# Patient Record
Sex: Female | Born: 1950 | Race: White | Hispanic: No | Smoking: Former smoker
Health system: Southern US, Community
[De-identification: ages and names within clinical notes are randomized; demographics above are authoritative.]

## PROBLEM LIST (undated history)

## (undated) DIAGNOSIS — E785 Hyperlipidemia, unspecified: Secondary | ICD-10-CM

## (undated) DIAGNOSIS — H269 Unspecified cataract: Secondary | ICD-10-CM

## (undated) DIAGNOSIS — J45909 Unspecified asthma, uncomplicated: Secondary | ICD-10-CM

## (undated) DIAGNOSIS — E079 Disorder of thyroid, unspecified: Secondary | ICD-10-CM

## (undated) HISTORY — PX: BREAST SURGERY: SHX581

## (undated) HISTORY — DX: Unspecified cataract: H26.9

## (undated) HISTORY — PX: ABDOMINAL HYSTERECTOMY: SHX81

## (undated) HISTORY — PX: BREAST EXCISIONAL BIOPSY: SUR124

## (undated) HISTORY — DX: Unspecified asthma, uncomplicated: J45.909

## (undated) HISTORY — DX: Disorder of thyroid, unspecified: E07.9

## (undated) HISTORY — DX: Hyperlipidemia, unspecified: E78.5

---

## 2015-09-05 LAB — LIPID PANEL
Cholesterol: 194 mg/dL (ref 0–200)
HDL: 54 mg/dL (ref 35–70)
LDL Cholesterol: 107 mg/dL

## 2015-09-05 LAB — HEMOGLOBIN A1C: Hemoglobin A1C: 6.3

## 2015-09-05 LAB — TSH: TSH: 1.17 u[IU]/mL (ref <=5.90)

## 2015-09-05 LAB — BASIC METABOLIC PANEL: CREATININE: 0.8 mg/dL (ref <=1.1)

## 2016-04-26 ENCOUNTER — Encounter: Payer: Self-pay | Admitting: Family Medicine

## 2016-04-26 ENCOUNTER — Ambulatory Visit (INDEPENDENT_AMBULATORY_CARE_PROVIDER_SITE_OTHER): Payer: Self-pay | Admitting: Family Medicine

## 2016-04-26 VITALS — BP 136/82 | HR 73 | Temp 97.9°F | Ht 59.45 in | Wt 163.6 lb

## 2016-04-26 DIAGNOSIS — Z6832 Body mass index (BMI) 32.0-32.9, adult: Secondary | ICD-10-CM | POA: Diagnosis not present

## 2016-04-26 DIAGNOSIS — J4520 Mild intermittent asthma, uncomplicated: Secondary | ICD-10-CM | POA: Diagnosis not present

## 2016-04-26 DIAGNOSIS — E034 Atrophy of thyroid (acquired): Secondary | ICD-10-CM | POA: Diagnosis not present

## 2016-04-26 DIAGNOSIS — H6592 Unspecified nonsuppurative otitis media, left ear: Secondary | ICD-10-CM | POA: Diagnosis not present

## 2016-04-26 LAB — TSH: TSH: 0.84 mIU/L

## 2016-04-26 MED ORDER — ALBUTEROL SULFATE HFA 108 (90 BASE) MCG/ACT IN AERS: 2.0000 | INHALATION_SPRAY | Freq: Four times a day (QID) | RESPIRATORY_TRACT | 3 refills | Status: DC | PRN

## 2016-04-26 NOTE — Patient Instructions (Signed)
Thanks for coming in today  For exercise please look at HIIT- high intensity interval training  Increase vegetable consumption

## 2016-04-26 NOTE — Progress Notes (Signed)
Pre visit review using our clinic review tool, if applicable. No additional management support is needed unless otherwise documented below in the visit note. 

## 2016-04-26 NOTE — Progress Notes (Signed)
Subjective:    Patient ID: Tanya Newman, female    DOB: 05/22/1950, 66 y.o.   MRN: [id number]  HPI This is a 66 yo female who has recently moved to [place] from [place]. She presents today to establish care. She moved to be closer to her daughter and grandchildren. She was able to keep her job and work from home. This has been an adjustment. She has joined the [employer] and enjoys pickleball. Was playing regularly in the fall and had blood work for insurance, HgbA1C had dropped below 6.  She brings her records with her today. Had CPE 6/17. Up to date on vaccines, FOBT, gyn, mammo.   Her only concern today is hypothyroidism- has noticed weight gain, fatigue recently, would like TSH checked. Endorses increased stress over last year.  Asthma- well controlled on Advair, rarely requires albuterol. Unable to take antihistamines, can take Nasacort. Requests refill of albuterol. Prefers [employer], works better for her.   Left ear- sensation of fullness. Had URI with ear infection 2 months ago. Treated at an urgent care with [facility]. Feels like it needs to pop. No drainage, no jaw pain. No decreased hearing.   Past Medical History:  Diagnosis Date  . Asthma   . Cataract   . Thyroid disease    Past Surgical History:  Procedure Laterality Date  . ABDOMINAL HYSTERECTOMY    . BREAST SURGERY     Family History  Problem Relation Age of Onset  . Diabetes Mother   . Stroke Mother   . Hypertension Mother   . Early death Father   . Heart disease Father   . Heart disease Sister   . Diabetes Sister   . Hypertension Sister   . Stroke Brother   . Cancer Brother    Social History  Substance Use Topics  . Smoking status: Former Research scientist (life sciences)  . Smokeless tobacco: Never Used  . Alcohol use Not on file      Review of Systems Per HPI    Objective:   Physical Exam  Constitutional: She is oriented to person, place, and time. She appears well-developed and well-nourished. No distress.  HENT:  Head:  Normocephalic and atraumatic.  Right Ear: Tympanic membrane, external ear and ear canal normal.  Left Ear: External ear normal. No tenderness. Tympanic membrane is not erythematous. A middle ear effusion is present.  Nose: Nose normal.  Mouth/Throat: Oropharynx is clear and moist.  Gross hearing intact.   Eyes: Conjunctivae are normal.  Cardiovascular: Normal rate, regular rhythm and normal heart sounds.   Pulmonary/Chest: Effort normal and breath sounds normal.  Lymphadenopathy:    She has no cervical adenopathy.  Neurological: She is alert and oriented to person, place, and time.  Skin: Skin is warm and dry. She is not diaphoretic.  Psychiatric: She has a normal mood and affect. Her behavior is normal. Judgment and thought content normal.  Vitals reviewed.     BP 136/82 (BP Location: Left Arm, Patient Position: Sitting, Cuff Size: Normal)   Pulse 73   Temp 97.9 F (36.6 C) (Oral)   Ht 4' 11.45" (1.51 m)   Wt 163 lb 9.6 oz (74.2 kg)   SpO2 98%   BMI 32.55 kg/m      Assessment & Plan:  1. Mild intermittent asthma without complication - albuterol (PROAIR HFA) 108 (90 Base) MCG/ACT inhaler; Inhale 2 puffs into the lungs every 6 (six) hours as needed for wheezing or shortness of breath.  Dispense: 18 g; Refill: 3  2. Hypothyroidism due to acquired atrophy of thyroid - TSH  3. Middle ear effusion, left - likely related to recent AOM, discussed starting Nasacort, symptoms should improve, if not or if pain or change in hearing, will refer to ENT  4. BMI 32.0-32.9, adult - discussed patient's concerns about developing diabetes due to strong family history and previous prediabetic HgbA1C. Discussed barriers to healthy food choices and regular exercise.   Follow up around 6/18 for CPE     Clarene Reamer, FNP-BC  [facility]  04/27/2016 9:27 AM

## 2016-04-27 DIAGNOSIS — E034 Atrophy of thyroid (acquired): Secondary | ICD-10-CM | POA: Insufficient documentation

## 2016-04-27 DIAGNOSIS — Z6832 Body mass index (BMI) 32.0-32.9, adult: Secondary | ICD-10-CM | POA: Insufficient documentation

## 2016-04-27 DIAGNOSIS — J4520 Mild intermittent asthma, uncomplicated: Secondary | ICD-10-CM | POA: Insufficient documentation

## 2016-04-29 ENCOUNTER — Encounter: Payer: Self-pay | Admitting: Family Medicine

## 2016-07-03 ENCOUNTER — Other Ambulatory Visit: Payer: Self-pay | Admitting: Family Medicine

## 2016-07-03 ENCOUNTER — Encounter: Payer: Self-pay | Admitting: Family Medicine

## 2016-07-03 DIAGNOSIS — E039 Hypothyroidism, unspecified: Secondary | ICD-10-CM

## 2016-07-03 DIAGNOSIS — E785 Hyperlipidemia, unspecified: Secondary | ICD-10-CM

## 2016-07-03 DIAGNOSIS — J4520 Mild intermittent asthma, uncomplicated: Secondary | ICD-10-CM

## 2016-07-03 MED ORDER — LEVOTHYROXINE SODIUM 112 MCG PO TABS: 112.0000 ug | ORAL_TABLET | Freq: Every day | ORAL | 1 refills | Status: DC

## 2016-07-03 MED ORDER — GEMFIBROZIL 600 MG PO TABS: 600.0000 mg | ORAL_TABLET | Freq: Every day | ORAL | 1 refills | Status: DC

## 2016-07-03 MED ORDER — MONTELUKAST SODIUM 10 MG PO TABS: 10.0000 mg | ORAL_TABLET | Freq: Every day | ORAL | 1 refills | Status: DC

## 2016-07-22 ENCOUNTER — Ambulatory Visit (INDEPENDENT_AMBULATORY_CARE_PROVIDER_SITE_OTHER): Payer: BLUE CROSS/BLUE SHIELD | Admitting: Family Medicine

## 2016-07-22 ENCOUNTER — Encounter: Payer: Self-pay | Admitting: Family Medicine

## 2016-07-22 VITALS — BP 130/78 | HR 79 | Temp 98.3°F | Wt 162.4 lb

## 2016-07-22 DIAGNOSIS — Z0000 Encounter for general adult medical examination without abnormal findings: Secondary | ICD-10-CM

## 2016-07-22 DIAGNOSIS — Z85828 Personal history of other malignant neoplasm of skin: Secondary | ICD-10-CM | POA: Insufficient documentation

## 2016-07-22 DIAGNOSIS — H9193 Unspecified hearing loss, bilateral: Secondary | ICD-10-CM

## 2016-07-22 DIAGNOSIS — J309 Allergic rhinitis, unspecified: Secondary | ICD-10-CM

## 2016-07-22 DIAGNOSIS — D229 Melanocytic nevi, unspecified: Secondary | ICD-10-CM | POA: Diagnosis not present

## 2016-07-22 DIAGNOSIS — R7303 Prediabetes: Secondary | ICD-10-CM | POA: Diagnosis not present

## 2016-07-22 DIAGNOSIS — R1013 Epigastric pain: Secondary | ICD-10-CM

## 2016-07-22 NOTE — Progress Notes (Signed)
Subjective:    Patient ID: Tanya Newman, female    DOB: 1950-09-21, 66 y.o.   MRN: [id number]  HPI  This is a 66 yo female who presents today for CPE. Has been very busy with her work. Did take a week off to go to [place] with her children and grandchildren.   Last CPE- 6/17 Mammo- several years Pap- hysterectomy  Colonoscopy- FOBT 04/2015 Tdap- 08/2015 Zostavax- 06/2012 Flu- annual Eye-up to date Dental-has appointment for next month Exercise-has not been exercising as much as she would like due to work demands    Allergies have been worse. Still on singulair and nasacort. Unable to tolerate antihistamines- gets very jittery. Worse symptom eye irritation. Has known dry eyes and is on Restasis.   Recently noticed more difficulty with hearing when there is back ground noise. Has to turn TV up. Worse in left ear. It itches and bothers her. Sometimes feel like balance is off, no falls. Has URI 12/17 and was told she had a lot of fluid behind ears. Doesn't feel like it has resolved. No occupational or recreational exposure to prolonged loud noises.    Has several years of abdominal pain. Thought it was related to stress so changed her job. Has eliminated all dairy products. Now has symptoms a couple of days a week. Feels like she has a brick in her stomach. Pain upper abdomen. Lasts for 1/2- 1 hour. BMs regular 2x/ day. Drinking a probiotic drink daily, not a full dose. Pain resolves spontaneously, not necessarily accompanied by bloating or gas. She is wondering if related to wheat consumption.     Past Medical History:  Diagnosis Date  . Asthma   . Cataract   . Thyroid disease    Past Surgical History:  Procedure Laterality Date  . ABDOMINAL HYSTERECTOMY    . BREAST SURGERY     Family History  Problem Relation Age of Onset  . Diabetes Mother   . Stroke Mother   . Hypertension Mother   . Early death Father   . Heart disease Father   . Heart disease Sister   . Diabetes  Sister   . Hypertension Sister   . Stroke Brother   . Cancer Brother    Social History  Substance Use Topics  . Smoking status: Former Research scientist (life sciences)  . Smokeless tobacco: Never Used  . Alcohol use Not on file      Review of Systems  Constitutional: Negative.   HENT: Positive for hearing loss.   Respiratory: Negative.   Cardiovascular: Negative.   Gastrointestinal: Positive for abdominal pain. Negative for constipation, diarrhea and nausea.  Endocrine: Negative.   Genitourinary: Negative.   Musculoskeletal: Negative.   Allergic/Immunologic: Positive for environmental allergies (scratcy throat, watery eyes, clear nasal drainage. ).  Neurological: Negative.   Hematological: Negative.        Objective:   Physical Exam Physical Exam  Constitutional: She is oriented to person, place, and time. She appears well-developed and well-nourished. No distress.  HENT:  Head: Normocephalic and atraumatic.  Right Ear: External ear normal.  Left Ear: External ear normal.  Nose: Nose normal.  Mouth/Throat: Oropharynx is clear and moist. No oropharyngeal exudate.  Ears: bilateral TMs dull Eyes: Conjunctivae are normal. Pupils are equal, round, and reactive to light.  Neck: Normal range of motion. Neck supple. No JVD present. No thyromegaly present.  Cardiovascular: Normal rate, regular rhythm, normal heart sounds and intact distal pulses.   Pulmonary/Chest: Effort normal and breath sounds normal. Right  breast exhibits no inverted nipple, no mass, no nipple discharge, no skin change and no tenderness. Left breast exhibits no inverted nipple, no mass, no nipple discharge, no skin change and no tenderness. Breasts are symmetrical.  Abdominal: Soft. Bowel sounds are normal. She exhibits no distension and no mass. There is no tenderness. There is no rebound and no guarding.  Musculoskeletal: Normal range of motion. She exhibits no edema or tenderness.  Lymphadenopathy:    She has no cervical  adenopathy.  Neurological: She is alert and oriented to person, place, and time. She has normal reflexes.  Skin: Skin is warm and dry. She is not diaphoretic. Numerous moles on upper chest.  Psychiatric: She has a normal mood and affect. Her behavior is normal. Judgment and thought content normal.  Vitals reviewed.  BP 130/78 (BP Location: Right Arm, Patient Position: Sitting, Cuff Size: Normal)   Pulse 79   Temp 98.3 F (36.8 C) (Oral)   Wt 162 lb 6.4 oz (73.7 kg)   SpO2 98%   BMI 32.31 kg/m  Wt Readings from Last 3 Encounters:  07/22/16 162 lb 6.4 oz (73.7 kg)  04/26/16 163 lb 9.6 oz (74.2 kg)   Depression screen PHQ 2/9 07/22/2016  Decreased Interest 0  Down, Depressed, Hopeless 0  PHQ - 2 Score 0   Office audiometry- pass right ear, fail left ear    Assessment & Plan:  1. Annual physical exam - Discussed and encouraged healthy lifestyle choices- adequate sleep, regular exercise, stress management and healthy food choices.  - patient wishes to defer labs until follow up visit  2. Allergic rhinitis, unspecified seasonality, unspecified trigger - discussed additional treatment of symptoms including local honey, antihistamine eye drops, saline nasal spray  3. Decreased hearing of both ears - Ambulatory referral to ENT  4. Epigastric pain - this has decreased in frequency since she eliminated dairy. Encouraged her to keep a food/symptom log to look for patterns. Consider [place] if no improvement or if symptoms seem to correlate with heavy/greasy foods  5. Personal history of squamous cell carcinoma of skin - discussed importance of regular dermatology follow up - Ambulatory referral to Dermatology  6. Prediabetes - encouraged increased exercise and healthy food choices - will check labs at follow up visit in 6 months per patient request  7. Numerous moles - Ambulatory referral to Dermatology   Clarene Reamer, FNP-BC  [facility]  07/24/2016 8:24 AM

## 2016-07-22 NOTE — Patient Instructions (Addendum)
Antihistamine eye drops and or lubrication eye drops  Probiotic- Florastor  Please call and schedule an appointment for screening mammogram. A referral is not needed.  [facility]    Please let me know which ENT provider you want to see and I will place referral  I have put in dermatology referral- you will get a call about an appointment  Keeping You Healthy  Get These Tests  Blood Pressure- Have your blood pressure checked by your healthcare provider at least once a year.  Normal blood pressure is 120/80.  Weight- Have your body mass index (BMI) calculated to screen for obesity.  BMI is a measure of body fat based on height and weight.  You can calculate your own BMI at [employer]velBags.it  Cholesterol- Have your cholesterol checked every year.  Diabetes- Have your blood sugar checked every year if you have high blood pressure, high cholesterol, a family history of diabetes or if you are overweight.  Pap Test - Have a pap test every 1 to 5 years if you have been sexually active.  If you are older than 65 and recent pap tests have been normal you may not need additional pap tests.  In addition, if you have had a hysterectomy  for benign disease additional pap tests are not necessary.  Mammogram-Yearly mammograms are essential for early detection of breast cancer  Screening for Colon Cancer- Colonoscopy starting at age 63. Screening may begin sooner depending on your family history and other health conditions.  Follow up colonoscopy as directed by your Gastroenterologist.  Screening for Osteoporosis- Screening begins at age 39 with bone density scanning, sooner if you are at higher risk for developing Osteoporosis.  Get these medicines  Calcium with Vitamin D- Your body requires 1200-1500 mg of Calcium a day and [phone] IU of Vitamin D a day.  You can only absorb 500 mg of Calcium at a time therefore Calcium must be taken in 2 or  3 separate doses throughout the day.  Hormones- Hormone therapy has been associated with increased risk for certain cancers and heart disease.  Talk to your healthcare provider about if you need relief from menopausal symptoms.  Aspirin- Ask your healthcare provider about taking Aspirin to prevent Heart Disease and Stroke.  Get these Immuniztions  Flu shot- Every fall  Pneumonia shot- Once after the age of 7; if you are younger ask your healthcare provider if you need a pneumonia shot.  Tetanus- Every ten years.  Zostavax- Once after the age of 52 to prevent shingles.  Take these steps  Don't smoke- Your healthcare provider can help you quit. For tips on how to quit, ask your healthcare provider or go to www.smokefree.gov or call 1-800 QUIT-NOW.  Be physically active- Exercise 5 days a week for a minimum of 30 minutes.  If you are not already physically active, start slow and gradually work up to 30 minutes of moderate physical activity.  Try walking, dancing, bike riding, swimming, etc.  Eat a healthy diet- Eat a variety of healthy foods such as fruits, vegetables, whole grains, low fat milk, low fat cheeses, yogurt, lean meats, chicken, fish, eggs, dried beans, tofu, etc.  For more information go to www.thenutritionsource.org  Dental visit- Brush and floss teeth twice daily; visit your dentist twice a year.  Eye exam- Visit your Optometrist or Ophthalmologist yearly.  Drink alcohol in moderation- Limit alcohol intake to one drink or less a day.  Never drink and drive.  Depression- Your emotional health is as important as your physical health.  If you're feeling down or losing interest in things you normally enjoy, please talk to your healthcare provider.  Seat Belts- can save your life; always wear one  Smoke/Carbon Monoxide detectors- These detectors need to be installed on the appropriate level of your home.  Replace batteries at least once a year.  Violence- If anyone is  threatening or hurting you, please tell your healthcare provider.  Living Will/ Health care power of attorney- Discuss with your healthcare provider and family.

## 2016-07-24 ENCOUNTER — Encounter: Payer: Self-pay | Admitting: Family Medicine

## 2016-08-01 ENCOUNTER — Encounter: Payer: Self-pay | Admitting: Family Medicine

## 2016-08-03 ENCOUNTER — Other Ambulatory Visit: Payer: Self-pay | Admitting: Family Medicine

## 2016-08-03 DIAGNOSIS — H9192 Unspecified hearing loss, left ear: Secondary | ICD-10-CM

## 2016-08-03 DIAGNOSIS — H7392 Unspecified disorder of tympanic membrane, left ear: Secondary | ICD-10-CM

## 2016-11-21 ENCOUNTER — Encounter: Payer: Self-pay | Admitting: Family Medicine

## 2016-11-22 ENCOUNTER — Ambulatory Visit (INDEPENDENT_AMBULATORY_CARE_PROVIDER_SITE_OTHER): Payer: BLUE CROSS/BLUE SHIELD | Admitting: Sports Medicine

## 2016-11-22 ENCOUNTER — Ambulatory Visit: Payer: Self-pay

## 2016-11-22 ENCOUNTER — Ambulatory Visit (INDEPENDENT_AMBULATORY_CARE_PROVIDER_SITE_OTHER): Payer: BLUE CROSS/BLUE SHIELD

## 2016-11-22 ENCOUNTER — Encounter: Payer: Self-pay | Admitting: Sports Medicine

## 2016-11-22 VITALS — BP 120/82 | HR 79 | Ht 59.45 in | Wt 167.6 lb

## 2016-11-22 DIAGNOSIS — M25561 Pain in right knee: Secondary | ICD-10-CM

## 2016-11-22 DIAGNOSIS — R7303 Prediabetes: Secondary | ICD-10-CM | POA: Diagnosis not present

## 2016-11-22 DIAGNOSIS — M25569 Pain in unspecified knee: Secondary | ICD-10-CM | POA: Insufficient documentation

## 2016-11-22 NOTE — Assessment & Plan Note (Signed)
Consistent with degenerative medial meniscus and mild underlying we will plan to go ahead and inject her today.  She tolerated this well.  Small effusion only.  Continue with compression and avoidance of exacerbating activities but encouraged her to remain mobile and active.  If any lack of improvement after 6 weeks she will return but otherwise just as needed.

## 2016-11-22 NOTE — Procedures (Signed)
PROCEDURE NOTE - ULTRASOUND GUIDED INJECTION: Right Knee Images were obtained and interpreted by myself, Teresa Coombs, DO  Images have been saved and stored to PACS system. Images obtained on: GE S7 Ultrasound machine  ULTRASOUND FINDINGS:  Small to moderate effusion. 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  Narrowing of the medial joint line but no focal osteophytic spurring.  DESCRIPTION OF PROCEDURE:  The patient's clinical condition is marked by substantial pain and/or significant functional disability. Other conservative therapy has not provided relief, is contraindicated, or not appropriate. There is a reasonable likelihood that injection will significantly improve the patient's pain and/or functional impairment. After discussing the risks, benefits and expected outcomes of the injection and all questions were reviewed and answered, the patient wished to undergo the above named procedure. Verbal consent was obtained. The ultrasound was used to identify the target structure and adjacent neurovascular structures. The skin was then prepped in sterile fashion and the target structure was injected under direct visualization using sterile technique as below: PREP: Alcohol, Ethel Chloride APPROACH: Superiolateral, single injection, 25g 1.5" needle INJECTATE: 2cc 1% lidocaine, 2 cc 40mg  DepoMedrol ASPIRATE: N/A DRESSING: Band-Aid & full knee Body Helix  Post procedural instructions including recommending icing and warning signs for infection were reviewed. This procedure was well tolerated and there were no complications.   IMPRESSION: Succesful US Guided Injection

## 2016-11-22 NOTE — Patient Instructions (Addendum)
You had an injection today.  Things to be aware of after injection are listed below: . You may experience no significant improvement or even a slight worsening in your symptoms during the first 24 to 48 hours.  After that we expect your symptoms to improve gradually over the next 2 weeks for the medicine to have its maximal effect.  You should continue to have improvement out to 6 weeks after your injection. . Dr. Rigby recommends icing the site of the injection for 20 minutes  1-2 times the day of your injection . You may shower but no swimming, tub bath or Jacuzzi for 24 hours. . If your bandage falls off this does not need to be replaced.  It is appropriate to remove the bandage after 4 hours. . You may resume light activities as tolerated unless otherwise directed per Dr. Rigby during your visit  POSSIBLE STEROID SIDE EFFECTS:  Side effects from injectable steroids tend to be less than when taken orally however you may experience some of the symptoms listed below.  If experienced these should only last for a short period of time. Change in menstrual flow  Edema (swelling)  Increased appetite Skin flushing (redness)  Skin rash/acne  Thrush (oral) Yeast vaginitis    Increased sweating  Depression Increased blood glucose levels Cramping and leg/calf  Euphoria (feeling happy)  POSSIBLE PROCEDURE SIDE EFFECTS: The side effects of the injection are usually fairly minimal however if you may experience some of the following side effects that are usually self-limited and will is off on their own.  If you are concerned please feel free to call the office with questions:  Increased numbness or tingling  Nausea or vomiting  Swelling or bruising at the injection site   Please call our office if if you experience any of the following symptoms over the next week as these can be signs of infection:   Fever greater than 100.5F  Significant swelling at the injection site  Significant redness or drainage  from the injection site  If after 2 weeks you are continuing to have worsening symptoms please call our office to discuss what the next appropriate actions should be including the potential for a return office visit or other diagnostic testing.      I recommend you obtained a compression sleeve to help with your joint problems. There are many options on the market however I recommend obtaining a Full Knee Body Helix compression sleeve.  You can find information (including how to appropriate measure yourself for sizing) can be found at www.Body Helix.com.  Many of these products are health savings account (HSA) eligible.   You can use the compression sleeve at any time throughout the day but is most important to use while being active as well as for 2 hours post-activity.   It is appropriate to ice following activity with the compression sleeve in place.    

## 2016-11-22 NOTE — Progress Notes (Signed)
OFFICE VISIT NOTE Tanya Newman. Newman, Tanya at [facility] [phone]  Tanya Newman - 66 y.o. female MRN [id number]  Date of birth: 11/28/1950  Visit Date: 11/22/2016  PCP: [name], FNP   Referred by: [name], FNP  [name], CMA acting as scribe for Dr. [name].  SUBJECTIVE:   Chief Complaint  Patient presents with  . RT knee pain and swelling   HPI: As below and per problem based documentation when appropriate.  Tanya Newman is a new patient presenting today with complaint of RT knee pain and swelling. Pain is all around the knee, anterior and posterior while driving, medial and lateral when at home resting, and all over pain when walking.  Pain has been present x 2-3 weeks.  No known injury or trauma to the knee. She has hx of fracture to the lateral tibia, pain flares up with cold weather.   Pain is described as aching and throbbing and is rated 5/10 when walking and later in the pain.   Pain is worse when walking, going up and down stairs. Pain is normally worse when first getting up in the morning.  Pain is alleviated with icing the knee. She has not tried using brace or compression.  She has tried taking Tylenol prn for the pain.   Pain radiates into the upper and lower leg. She denies pain in the hip and ankle.   No recent xray of the knee.      Review of Systems  Constitutional: Negative for chills and fever.  Respiratory: Negative for shortness of breath and wheezing.   Cardiovascular: Positive for leg swelling. Negative for chest pain and palpitations.  Gastrointestinal: Negative.   Genitourinary: Negative.   Musculoskeletal: Positive for joint pain. Negative for falls.  Neurological: Negative for dizziness, tingling and headaches.  Endo/Heme/Allergies: Does not bruise/bleed easily.    Otherwise per HPI.  HISTORY & PERTINENT PRIOR DATA:  No specialty comments available. She reports that she  has quit smoking. She has never used smokeless tobacco. No results for input(s): HGBA1C, LABURIC in the last 8760 hours. Medications & Allergies reviewed per EMR Patient Active Problem List   Diagnosis Date Noted  . Knee pain 11/22/2016  . Personal history of squamous cell carcinoma of skin 07/22/2016  . Prediabetes 07/22/2016  . Mild intermittent asthma without complication 57/32/2025  . Hypothyroidism due to acquired atrophy of thyroid 04/27/2016  . BMI 32.0-32.9,adult 04/27/2016   Past Medical History:  Diagnosis Date  . Asthma   . Cataract   . Thyroid disease    Family History  Problem Relation Age of Onset  . Diabetes Mother   . Stroke Mother   . Hypertension Mother   . Early death Father   . Heart disease Father   . Heart disease Sister   . Diabetes Sister   . Hypertension Sister   . Stroke Brother   . Cancer Brother    Past Surgical History:  Procedure Laterality Date  . ABDOMINAL HYSTERECTOMY    . BREAST SURGERY     Social History   Occupational History  . Not on file.   Social History Main Topics  . Smoking status: Former Research scientist (life sciences)  . Smokeless tobacco: Never Used  . Alcohol use Not on file  . Drug use: Unknown  . Sexual activity: No    OBJECTIVE:  VS:  HT:4' 11.45" (151 cm)   WT:167 lb 9.6 oz (76 kg)  BMI:33.34  BP:120/82  HR:79bpm  TEMP: ( )  RESP:96 % EXAM: Findings:  WDWN, NAD, Non-toxic appearing Alert & appropriately interactive Not depressed or anxious appearing No increased work of breathing. Pupils are equal. EOM intact without nystagmus No clubbing or cyanosis of the extremities appreciated No significant rashes/lesions/ulcerations overlying the examined area. DP & PT pulses 2+/4.  No significant pretibial edema.  No clubbing or cyanosis Sensation intact to light touch in lower extremities.  Right Knee: Overall joint is well aligned, no significant deformity.   Small to moderate effusion..   ROM: 0 to 120.   Extensor  mechanism intact Generalized medial and lateral joint line pain without focal deformity..   Stable to varus/valgus strain & anterior/posterior drawer.     Mild pain with McMurray's but no reproducible mechanical clicking.       [place] Limited Joint Space Structures Low Right(no Linked Charges)  Result Date: 11/22/2016 [name], DO     11/22/2016 12:01 PM PROCEDURE NOTE - ULTRASOUND GUIDED INJECTION: Right Knee Images were obtained and interpreted by myself, Teresa Coombs, DO Images have been saved and stored to PACS system. Images obtained on: GE S7 Ultrasound machine  ULTRASOUND FINDINGS: Small to moderate effusion. 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  Narrowing of the medial joint line but no focal osteophytic spurring. DESCRIPTION OF PROCEDURE: The patient's clinical condition is marked by substantial pain and/or significant functional disability. Other conservative therapy has not provided relief, is contraindicated, or not appropriate. There is a reasonable likelihood that injection will significantly improve the patient's pain and/or functional impairment. After discussing the risks, benefits and expected outcomes of the injection and all questions were reviewed and answered, the patient wished to undergo the above named procedure. Verbal consent was obtained. The ultrasound was used to identify the target structure and adjacent neurovascular structures. The skin was then prepped in sterile fashion and the target structure was injected under direct visualization using sterile technique as below: PREP: Alcohol, Ethel Chloride APPROACH: Superiolateral, single injection, 25g 1.5" needle INJECTATE: 2cc 1% lidocaine, 2 cc 40mg  DepoMedrol ASPIRATE: N/A DRESSING: Band-Aid & full knee Body Helix Post procedural instructions including recommending icing and warning signs for infection were reviewed. This procedure was well  tolerated and there were no complications.  IMPRESSION: Succesful US Guided Injection   ASSESSMENT & PLAN:     ICD-10-CM   1. Acute pain of right knee M25.561 DG Knee AP/LAT W/Sunrise Right    [place] LIMITED JOINT SPACE STRUCTURES LOW RIGHT(NO LINKED CHARGES)  2. Prediabetes R73.03   ================================================================= Knee pain Consistent with degenerative medial meniscus and mild underlying we will plan to go ahead and inject her today.  She tolerated this well.  Small effusion only.  Continue with compression and avoidance of exacerbating activities but encouraged her to remain mobile and active.  If any lack of improvement after 6 weeks she will return but otherwise just as needed. =================================================================  Follow-up: Return in about 6 weeks (around 01/03/2017).   CMA/ATC served as Education administrator during this visit. History, Physical, and Plan performed by medical provider. Documentation and orders reviewed and attested to.      Teresa Coombs, [place] [facility] Physician

## 2016-12-31 ENCOUNTER — Other Ambulatory Visit: Payer: Self-pay | Admitting: Emergency Medicine

## 2016-12-31 ENCOUNTER — Telehealth: Payer: Self-pay | Admitting: Family Medicine

## 2016-12-31 DIAGNOSIS — E785 Hyperlipidemia, unspecified: Secondary | ICD-10-CM

## 2016-12-31 MED ORDER — GEMFIBROZIL 600 MG PO TABS: 600.0000 mg | ORAL_TABLET | Freq: Every day | ORAL | 0 refills | Status: DC

## 2016-12-31 NOTE — Telephone Encounter (Signed)
30 day supply sent to pharmacy. Lab appointment needed and further refills will go to new PCP.

## 2016-12-31 NOTE — Telephone Encounter (Signed)
MEDICATION: gemfibrozil (LOPID) 600 MG tablet  PHARMACY:   [id number] - [place], [place] - [address] [phone] (Phone) [phone] (Fax)     IS THIS A 90 DAY SUPPLY : Y  IS PATIENT OUT OF MEDICATION: N  IF NOT; HOW MUCH IS LEFT: 5 pills  LAST APPOINTMENT DATE: 07/22/16  NEXT APPOINTMENT DATE: 01/22/17 with Dr. Jerline Pain  OTHER COMMENTS: Patient transferring to Dr. Jerline Pain   **Let patient know to contact pharmacy at the end of the day to make sure medication is ready. **  ** Please notify patient to allow 48-72 hours to process**  **Encourage patient to contact the pharmacy for refills or they can request refills through [facility]**

## 2017-01-02 ENCOUNTER — Telehealth: Payer: Self-pay | Admitting: Family Medicine

## 2017-01-02 DIAGNOSIS — J4520 Mild intermittent asthma, uncomplicated: Secondary | ICD-10-CM

## 2017-01-02 DIAGNOSIS — E039 Hypothyroidism, unspecified: Secondary | ICD-10-CM

## 2017-01-02 MED ORDER — MONTELUKAST SODIUM 10 MG PO TABS: 10.0000 mg | ORAL_TABLET | Freq: Every day | ORAL | 3 refills | Status: DC

## 2017-01-02 MED ORDER — LEVOTHYROXINE SODIUM 112 MCG PO TABS: 112.0000 ug | ORAL_TABLET | Freq: Every day | ORAL | 3 refills | Status: DC

## 2017-01-02 NOTE — Telephone Encounter (Signed)
MEDICATION:  levothyroxine (SYNTHROID, LEVOTHROID) 112 MCG tablet    montelukast (SINGULAIR) 10 MG tablet     PHARMACY:   [id number] - [place], [place] - [address] [phone] (Phone) [phone] (Fax)     IS THIS A 90 DAY SUPPLY : yes  IS PATIENT OUT OF MEDICATION: yes  IF NOT; HOW MUCH IS LEFT:   LAST APPOINTMENT DATE: @05 /07/18  NEXT APPOINTMENT DATE:@11 /07/18 OTHER COMMENTS: patient transferred from [place] to Dr. Jerline Pain. Has new patient appointment on 01/22/17.    **Let patient know to contact pharmacy at the end of the day to make sure medication is ready. **  ** Please notify patient to allow 48-72 hours to process**  **Encourage patient to contact the pharmacy for refills or they can request refills through [facility]**

## 2017-01-02 NOTE — Telephone Encounter (Signed)
Rx filled.  Algis Greenhouse. Jerline Pain, MD 01/02/2017 1:55 PM

## 2017-01-03 ENCOUNTER — Ambulatory Visit (INDEPENDENT_AMBULATORY_CARE_PROVIDER_SITE_OTHER): Payer: BLUE CROSS/BLUE SHIELD | Admitting: Sports Medicine

## 2017-01-03 ENCOUNTER — Encounter: Payer: Self-pay | Admitting: Sports Medicine

## 2017-01-03 VITALS — BP 112/78 | HR 73 | Ht 59.45 in | Wt 168.2 lb

## 2017-01-03 DIAGNOSIS — M25561 Pain in right knee: Secondary | ICD-10-CM | POA: Diagnosis not present

## 2017-01-03 NOTE — Assessment & Plan Note (Signed)
Responded well to injection, recommend continuing with VMO strengthening.  Follow-up only as needed.  Continue compression when active.Marland Kitchen

## 2017-01-03 NOTE — Progress Notes (Signed)
OFFICE VISIT NOTE Tanya Newman. Tanya Newman, [place] at [facility] [phone]  Tanya Newman - 66 y.o. female MRN [id number]  Date of birth: 21-Jul-1950  Visit Date: 01/03/2017  PCP: [name], FNP   Referred by: [name], FNP  Josepha Pigg, CMA acting as scribe for Dr. [name].  SUBJECTIVE:   Chief Complaint  Patient presents with  . Follow-up    RT knee pain   HPI: As below and per problem based documentation when appropriate.  Tanya Newman is an established patient presenting today in follow-up of RT knee pain. She was last seen 11/22/16  received steroid injection and was advised to wear Body Helix compression sleeve.   Pt had pain in the RT knee the rest of the day after receiving the injection. She continued to have pain for the following 3 days but then the pain started to improve. By the 5th day she started taking short walks and would have some pain afterward. She continues to have mild pain after ambulating for extended periods of time. She feels like the pain has improved a lot since her last visit and the swelling has gone down. She has been wearing the Body Helix compression sleeve while being active or driving. She no longer has pain when going up and down stairs or when first waking up in the morning.     Review of Systems  Constitutional: Negative for chills, fever and malaise/fatigue.  Respiratory: Negative for shortness of breath and wheezing.   Cardiovascular: Negative for chest pain and palpitations.  Neurological: Negative for dizziness, tingling, weakness and headaches.    Otherwise per HPI.  HISTORY & PERTINENT PRIOR DATA:  No specialty comments available. She reports that she has quit smoking. She has never used smokeless tobacco. No results for input(s): HGBA1C, LABURIC in the last 8760 hours. Allergies reviewed per EMR Prior to Admission medications   Medication Sig Start Date End Date Taking?  Authorizing Provider  albuterol (PROAIR HFA) 108 (90 Base) MCG/ACT inhaler Inhale 2 puffs into the lungs every 6 (six) hours as needed for wheezing or shortness of breath. 04/26/16  Yes [name], FNP  cycloSPORINE (RESTASIS) 0.05 % ophthalmic emulsion 1 drop 2 (two) times daily.   Yes [provider]  doxycycline (VIBRAMYCIN) 50 MG capsule  07/05/16  Yes [provider]  Fluticasone-Salmeterol (ADVAIR) 500-50 MCG/DOSE AEPB Inhale 1 puff into the lungs 2 (two) times daily.   Yes [provider]  gemfibrozil (LOPID) 600 MG tablet Take 1 tablet (600 mg total) by mouth daily. 12/31/16  Yes [name], FNP  levothyroxine (SYNTHROID, LEVOTHROID) 112 MCG tablet Take 1 tablet (112 mcg total) by mouth daily before breakfast. 01/02/17  Yes Vivi Barrack, MD  montelukast (SINGULAIR) 10 MG tablet Take 1 tablet (10 mg total) by mouth at bedtime. 01/02/17  Yes Vivi Barrack, MD   Patient Active Problem List   Diagnosis Date Noted  . Knee pain 11/22/2016  . Personal history of squamous cell carcinoma of skin 07/22/2016  . Prediabetes 07/22/2016  . Mild intermittent asthma without complication 71/24/5809  . Hypothyroidism due to acquired atrophy of thyroid 04/27/2016  . BMI 32.0-32.9,adult 04/27/2016   Past Medical History:  Diagnosis Date  . Asthma   . Cataract   . Thyroid disease    Family History  Problem Relation Age of Onset  . Diabetes Mother   . Stroke Mother   . Hypertension Mother   .  Early death Father   . Heart disease Father   . Heart disease Sister   . Diabetes Sister   . Hypertension Sister   . Stroke Brother   . Cancer Brother    Past Surgical History:  Procedure Laterality Date  . ABDOMINAL HYSTERECTOMY    . BREAST SURGERY     Social History   Occupational History  . Not on file.   Social History Main Topics  . Smoking status: Former Research scientist (life sciences)  . Smokeless tobacco: Never Used  . Alcohol use Not on file  . Drug use: Unknown    . Sexual activity: No    OBJECTIVE:  VS:  HT:4' 11.45" (151 cm)   WT:168 lb 3.2 oz (76.3 kg)  BMI:33.46    BP:112/78  HR:73bpm  TEMP: ( )  RESP:98 % EXAM: Findings:  Knee is overall well aligned.  No effusion.  She has a small amount of difficulty with slightly externally rotated straight leg raise.  Otherwise knee extension strength is 5 out of 5, ligamentously stable.  No pain with McMurray's.    RADIOLOGY: DG Knee AP/LAT W/Sunrise Right CLINICAL DATA:  Right knee pain and swelling for 2-3 weeks. Pt says pain is mainly around patella, but also notes some pain behind knee. No known injury.  EXAM: RIGHT KNEE 3 VIEWS  COMPARISON:  None.  FINDINGS: Three views of the right knee are provided. Comparison AP view of the left knee provided.  Osseous alignment is normal, perhaps mild narrowing of the medial compartment which is similar to the appearance of the left knee. No degenerative spurring, osteophytes or other signs of advanced degenerative osteoarthritis. No erosions or other signs of an inflammatory arthritis.  No acute or suspicious osseous finding. Questionable small joint effusion within the suprapatellar bursa.  IMPRESSION: 1. Perhaps mild degenerative narrowing of the medial compartment, but no osteophytes or other signs of advanced osteoarthritis. 2. No acute appearing osseous abnormality. 3. Questionable small joint effusion.  Electronically Signed   By: Franki Cabot M.D.   On: 11/22/2016 12:55 [place] LIMITED JOINT SPACE STRUCTURES LOW RIGHT(NO LINKED CHARGES) [name], DO     11/22/2016 12:01 PM PROCEDURE NOTE - ULTRASOUND GUIDED INJECTION: Right Knee Images were obtained and interpreted by myself, Teresa Coombs, DO   Images have been saved and stored to PACS system. Images obtained on: GE S7 Ultrasound machine  ULTRASOUND FINDINGS:  Small to moderate effusion. Degenerative bulging of both medial and lateral meniscus with  possible  interstitial split of the medial meniscus.  Narrowing of  the medial joint line but no focal osteophytic spurring.  DESCRIPTION OF PROCEDURE:  The patient's clinical condition is marked by substantial pain  and/or significant functional disability. Other conservative  therapy has not provided relief, is contraindicated, or not  appropriate. There is a reasonable likelihood that injection will  significantly improve the patient's pain and/or functional  impairment. After discussing the risks, benefits and expected  outcomes of the injection and all questions were reviewed and  answered, the patient wished to undergo the above named  procedure. Verbal consent was obtained. The ultrasound was used  to identify the target structure and adjacent neurovascular  structures. The skin was then prepped in sterile fashion and the  target structure was injected under direct visualization using  sterile technique as below: PREP: Alcohol, Ethel Chloride APPROACH: Superiolateral, single injection, 25g 1.5" needle INJECTATE: 2cc 1% lidocaine, 2 cc 40mg  DepoMedrol ASPIRATE: N/A DRESSING: Band-Aid & full knee Body Helix  Post procedural instructions including recommending icing and  warning signs for infection were reviewed. This procedure was  well tolerated and there were no complications.   IMPRESSION: Succesful US Guided Injection  ASSESSMENT & PLAN:     ICD-10-CM   1. Acute pain of right knee M25.561    ================================================================= Knee pain Responded well to injection, recommend continuing with VMO strengthening.  Follow-up only as needed.  Continue compression when active..  No notes on file ================================================================= Patient Instructions     ================================================================= Future Appointments Date Time Provider [place]  01/22/2017 9:40 AM Vivi Barrack, MD  [facility] None    Follow-up: Return if symptoms worsen or fail to improve.   CMA/ATC served as Education administrator during this visit. History, Physical, and Plan performed by medical provider. Documentation and orders reviewed and attested to.      Teresa Coombs, [place] [facility] Physician

## 2017-01-09 ENCOUNTER — Encounter: Payer: Self-pay | Admitting: Family Medicine

## 2017-01-13 ENCOUNTER — Other Ambulatory Visit: Payer: Self-pay | Admitting: Family Medicine

## 2017-01-13 DIAGNOSIS — E039 Hypothyroidism, unspecified: Secondary | ICD-10-CM

## 2017-01-13 MED ORDER — LEVOTHYROXINE SODIUM 112 MCG PO TABS: 112.0000 ug | ORAL_TABLET | Freq: Every day | ORAL | 0 refills | Status: DC

## 2017-01-15 ENCOUNTER — Other Ambulatory Visit: Payer: Self-pay | Admitting: Family Medicine

## 2017-01-15 MED ORDER — LEVOTHYROXINE SODIUM 112 MCG PO TABS: 112.0000 ug | ORAL_TABLET | Freq: Every day | ORAL | 0 refills | Status: DC

## 2017-01-16 ENCOUNTER — Telehealth: Payer: Self-pay

## 2017-01-16 NOTE — Telephone Encounter (Signed)
DG-Pharmacy faxed note requesting an alternative as the Levothyroxine 146mcg is unavailable through manufacturer/this has happened with other patients as well/Plz advise if you would like to tweak the dosage or alternate dosages to adjust? thx dmf

## 2017-01-17 NOTE — Telephone Encounter (Signed)
Called pharmacy and they have plenty of Synthroid (brand) in stock. I notified patient. Prescription was sent two days ago. Patient will call her pharmacy and let them know that she desires fill of Synthroid.

## 2017-01-22 ENCOUNTER — Encounter: Payer: Self-pay | Admitting: Family Medicine

## 2017-01-22 ENCOUNTER — Ambulatory Visit: Payer: BLUE CROSS/BLUE SHIELD | Admitting: Family Medicine

## 2017-01-22 VITALS — BP 122/98 | HR 64 | Ht 59.0 in | Wt 166.2 lb

## 2017-01-22 DIAGNOSIS — E034 Atrophy of thyroid (acquired): Secondary | ICD-10-CM | POA: Diagnosis not present

## 2017-01-22 DIAGNOSIS — Z1159 Encounter for screening for other viral diseases: Secondary | ICD-10-CM

## 2017-01-22 DIAGNOSIS — J4520 Mild intermittent asthma, uncomplicated: Secondary | ICD-10-CM

## 2017-01-22 DIAGNOSIS — R7303 Prediabetes: Secondary | ICD-10-CM

## 2017-01-22 DIAGNOSIS — E039 Hypothyroidism, unspecified: Secondary | ICD-10-CM

## 2017-01-22 DIAGNOSIS — E785 Hyperlipidemia, unspecified: Secondary | ICD-10-CM | POA: Diagnosis not present

## 2017-01-22 HISTORY — DX: Hyperlipidemia, unspecified: E78.5

## 2017-01-22 LAB — POCT GLYCOSYLATED HEMOGLOBIN (HGB A1C): HEMOGLOBIN A1C: 6.3

## 2017-01-22 MED ORDER — LEVOTHYROXINE SODIUM 112 MCG PO TABS: 112.0000 ug | ORAL_TABLET | Freq: Every day | ORAL | 3 refills | Status: DC

## 2017-01-22 MED ORDER — METFORMIN HCL ER 500 MG PO TB24: 500.0000 mg | ORAL_TABLET | Freq: Every day | ORAL | 3 refills | Status: DC

## 2017-01-22 MED ORDER — GEMFIBROZIL 600 MG PO TABS: 600.0000 mg | ORAL_TABLET | Freq: Every day | ORAL | 3 refills | Status: DC

## 2017-01-22 MED ORDER — ALBUTEROL SULFATE HFA 108 (90 BASE) MCG/ACT IN AERS: 2.0000 | INHALATION_SPRAY | Freq: Four times a day (QID) | RESPIRATORY_TRACT | 3 refills | Status: DC | PRN

## 2017-01-22 MED ORDER — MONTELUKAST SODIUM 10 MG PO TABS: 10.0000 mg | ORAL_TABLET | Freq: Every day | ORAL | 3 refills | Status: DC

## 2017-01-22 MED ORDER — MOMETASONE FURO-FORMOTEROL FUM 200-5 MCG/ACT IN AERO: 2.0000 | INHALATION_SPRAY | Freq: Every day | RESPIRATORY_TRACT | 11 refills | Status: DC

## 2017-01-22 NOTE — Assessment & Plan Note (Signed)
Continue levothyroxine at 112 mcg daily.  Check TSH.

## 2017-01-22 NOTE — Progress Notes (Signed)
    Subjective:  Tanya Newman is a 66 y.o. female who presents today with a chief complaint of asthma follow-up.  Patient will be relocating to [place], [place] in the next 1-2 years.  HPI:  Asthma, chronic issue, stable Currently on Advair and albuterol.  Is doing well with his regimen, however notes that Advair causes her to have sores in her mouth.  She is rinsing her mouth out daily with hydrogen peroxide to prevent the sores though they are still occurring.  She is having to use her albuterol maybe once or twice per week.  No current wheezing.  No shortness of breath.  No cough.  She has been on several medications in the past.  Ruthe Mannan has worked very well for her in the past.  She is also taking Singulair which seems to be helping with her symptoms.  Hyperlipidemia, chronic issue, stable Taking Lopid daily.  Does not regularly exercise.  Tolerating medication without side effects.  Prediabetes, chronic issue, stable Not currently on any medications.  As noted above, patient does not regularly exercise.  She is trying to avoid sweetened beverages and sugars as much as possible she does occasionally have chips and bread.  Wants to get back into exercising more however is limited due to her knee pain.  Hypothyroidism, chronic issue, stable Currently on levothyroxine 112 mcg daily.  Pharmacy was unable to fill her generic version of levothyroxine during her last visit so she is now on Synthroid 112 mcg daily.  No palpitations.  She has noted mild weight gain.  No skin changes.  ROS: Per HPI  PMH: Smoking history reviewed.  Never smoker.  Objective:  Physical Exam: BP (!) 122/98   Pulse 64   Ht 4\' 11"  (1.499 m)   Wt 166 lb 3.2 oz (75.4 kg)   SpO2 97%   BMI 33.57 kg/m   Gen: NAD, resting comfortably CV: RRR with no murmurs appreciated Pulm: NWOB, CTAB with no crackles, wheezes, or rhonchi GI: Normal bowel sounds present. Soft, Nontender, Nondistended. MSK: No edema, cyanosis, or  clubbing noted Skin: Warm, dry Neuro: Grossly normal, moves all extremities Psych: Normal affect and thought content  Results for orders placed or performed in visit on 01/22/17 (from the past 72 hour(s))  POCT glycosylated hemoglobin (Hb A1C)     Status: None   Collection Time: 01/22/17  9:51 AM  Result Value Ref Range   Hemoglobin A1C 6.3     Assessment/Plan:  Mild intermittent asthma without complication Stable today, however patient is having adverse reaction to her Advair.  Continue albuterol as needed.  Continue Singulair.  Switch Advair to [facility].  Follow-up in 6 months.  Return precautions reviewed.  Hypothyroidism due to acquired atrophy of thyroid Continue levothyroxine at 112 mcg daily.  Check TSH.  Prediabetes A1 stable at 6.3% today.  Discussed lifestyle interventions including healthy diet and exercise with patient.  Will start metformin 500 mg daily.  Discussed side effects of this medication with patient.  Follow-up in 6 months.  Hyperlipidemia Continue Lopid.  Check lipid panel.  Preventive healthcare Flu shot deferred today.  Patient due for mammogram and DEXA scan-stated she would get these in the spring.  Also discussed need for colon cancer screening-gave information colouard.  Check hepatitis C antibody with blood draw.  Algis Greenhouse. Jerline Pain, MD 01/22/2017 10:21 AM

## 2017-01-22 NOTE — Assessment & Plan Note (Signed)
A1 stable at 6.3% today.  Discussed lifestyle interventions including healthy diet and exercise with patient.  Will start metformin 500 mg daily.  Discussed side effects of this medication with patient.  Follow-up in 6 months.

## 2017-01-22 NOTE — Assessment & Plan Note (Signed)
Stable today, however patient is having adverse reaction to her Advair.  Continue albuterol as needed.  Continue Singulair.  Switch Advair to [facility].  Follow-up in 6 months.  Return precautions reviewed.

## 2017-01-22 NOTE — Patient Instructions (Signed)
Your A1c today is 6.3. We will start metformin. Please let me know if you have severe side effects.  I also sent in a prescription for dulera.  I refilled your other medications.  Come back soon to have your labs drawn.  You will need a mammogram and bone density scan soon. We can discuss at our next appointment.  Come back to see me in 6 months.  Take care,  Dr Jerline Pain

## 2017-01-22 NOTE — Assessment & Plan Note (Signed)
Continue Lopid.  Check lipid panel.

## 2017-01-24 ENCOUNTER — Other Ambulatory Visit (INDEPENDENT_AMBULATORY_CARE_PROVIDER_SITE_OTHER): Payer: BLUE CROSS/BLUE SHIELD

## 2017-01-24 DIAGNOSIS — E785 Hyperlipidemia, unspecified: Secondary | ICD-10-CM

## 2017-01-24 DIAGNOSIS — Z1159 Encounter for screening for other viral diseases: Secondary | ICD-10-CM

## 2017-01-24 DIAGNOSIS — E039 Hypothyroidism, unspecified: Secondary | ICD-10-CM | POA: Diagnosis not present

## 2017-01-24 LAB — LIPID PANEL
CHOL/HDL RATIO: 4
CHOLESTEROL: 209 mg/dL — AB (ref 0–200)
HDL: 50.6 mg/dL (ref >39.00)
LDL CALC: 131 mg/dL — AB (ref 0–99)
NonHDL: 158.52
TRIGLYCERIDES: 139 mg/dL (ref 0.0–149.0)
VLDL: 27.8 mg/dL (ref 0.0–40.0)

## 2017-01-24 LAB — TSH: TSH: 1.41 u[IU]/mL (ref 0.35–4.50)

## 2017-01-24 LAB — CBC
HEMATOCRIT: 44.2 % (ref 36.0–46.0)
Hemoglobin: 14.4 g/dL (ref 12.0–15.0)
MCHC: 32.5 g/dL (ref 30.0–36.0)
MCV: 91.7 fl (ref 78.0–100.0)
Platelets: 205 10*3/uL (ref 150.0–400.0)
RBC: 4.82 Mil/uL (ref 3.87–5.11)
RDW: 13.8 % (ref 11.5–15.5)
WBC: 4.4 10*3/uL (ref 4.0–10.5)

## 2017-01-24 LAB — COMPREHENSIVE METABOLIC PANEL
ALBUMIN: 4.4 g/dL (ref 3.5–5.2)
ALT: 24 U/L (ref 0–35)
AST: 18 U/L (ref 0–37)
Alkaline Phosphatase: 56 U/L (ref 39–117)
BUN: 13 mg/dL (ref 6–23)
CALCIUM: 9.6 mg/dL (ref 8.4–10.5)
CHLORIDE: 104 meq/L (ref 96–112)
CO2: 27 meq/L (ref 19–32)
Creatinine, Ser: 0.79 mg/dL (ref 0.40–1.20)
GFR: 77.62 mL/min (ref >60.00)
Glucose, Bld: 108 mg/dL — ABNORMAL HIGH (ref 70–99)
POTASSIUM: 4 meq/L (ref 3.5–5.1)
SODIUM: 140 meq/L (ref 135–145)
Total Bilirubin: 0.4 mg/dL (ref 0.2–1.2)
Total Protein: 6.9 g/dL (ref 6.0–8.3)

## 2017-01-25 LAB — HEPATITIS C ANTIBODY
Hepatitis C Ab: NONREACTIVE
SIGNAL TO CUT-OFF: 0.02 (ref <1.00)

## 2017-02-10 ENCOUNTER — Encounter: Payer: Self-pay | Admitting: Family Medicine

## 2017-02-11 ENCOUNTER — Encounter: Payer: Self-pay | Admitting: Family Medicine

## 2017-02-11 NOTE — Telephone Encounter (Signed)
Would be ok with 153mcg levothyroxine. She needs to come in in 4-6 weeks for thyroid testing.  Algis Greenhouse. Jerline Pain, MD 02/11/2017 5:13 PM

## 2017-02-12 ENCOUNTER — Other Ambulatory Visit: Payer: Self-pay

## 2017-02-12 DIAGNOSIS — E034 Atrophy of thyroid (acquired): Secondary | ICD-10-CM

## 2017-02-12 MED ORDER — LEVOTHYROXINE SODIUM 100 MCG PO TABS: 100.0000 ug | ORAL_TABLET | Freq: Every day | ORAL | 2 refills | Status: DC

## 2017-04-10 ENCOUNTER — Other Ambulatory Visit: Payer: Self-pay

## 2017-07-22 ENCOUNTER — Encounter: Payer: Self-pay | Admitting: Family Medicine

## 2017-07-22 ENCOUNTER — Ambulatory Visit (INDEPENDENT_AMBULATORY_CARE_PROVIDER_SITE_OTHER): Payer: Managed Care, Other (non HMO) | Admitting: Family Medicine

## 2017-07-22 VITALS — BP 130/80 | HR 62 | Temp 97.9°F | Resp 14 | Ht 61.0 in | Wt 163.0 lb

## 2017-07-22 DIAGNOSIS — E034 Atrophy of thyroid (acquired): Secondary | ICD-10-CM

## 2017-07-22 DIAGNOSIS — R7303 Prediabetes: Secondary | ICD-10-CM | POA: Diagnosis not present

## 2017-07-22 DIAGNOSIS — Z0001 Encounter for general adult medical examination with abnormal findings: Secondary | ICD-10-CM

## 2017-07-22 DIAGNOSIS — E785 Hyperlipidemia, unspecified: Secondary | ICD-10-CM

## 2017-07-22 DIAGNOSIS — M25561 Pain in right knee: Secondary | ICD-10-CM

## 2017-07-22 DIAGNOSIS — J4520 Mild intermittent asthma, uncomplicated: Secondary | ICD-10-CM

## 2017-07-22 MED ORDER — FLUTICASONE-SALMETEROL 500-50 MCG/DOSE IN AEPB: 1.0000 | INHALATION_SPRAY | Freq: Every day | RESPIRATORY_TRACT | 11 refills | Status: DC

## 2017-07-22 NOTE — Assessment & Plan Note (Signed)
Continue Synthroid.  Last TSH 6 months ago in range.  Will recheck in 6 months.

## 2017-07-22 NOTE — Assessment & Plan Note (Signed)
Recommended continued use of compression sleeve, continued home exercise program, and occasional use of NSAIDs as needed.  She will follow-up with sports medicine as needed.

## 2017-07-22 NOTE — Patient Instructions (Addendum)
It was very nice to see you today! You are doing great. Keep up the good work! I will send in a prescription for your advair. Please have your mammogram and DEXA soon. I will get records from your previous doctor.  Come back in 6 months, or sooner as needed.   Preventive Care 66 Years and Older, Female Preventive care refers to lifestyle choices and visits with your health care provider that can promote health and wellness. What does preventive care include?  A yearly physical exam. This is also called an annual well check.  Dental exams once or twice a year.  Routine eye exams. Ask your health care provider how often you should have your eyes checked.  Personal lifestyle choices, including: ? Daily care of your teeth and gums. ? Regular physical activity. ? Eating a healthy diet. ? Avoiding tobacco and drug use. ? Limiting alcohol use. ? Practicing safe sex. ? Taking low-dose aspirin every day. ? Taking vitamin and mineral supplements as recommended by your health care provider. What happens during an annual well check? The services and screenings done by your health care provider during your annual well check will depend on your age, overall health, lifestyle risk factors, and family history of disease. Counseling Your health care provider may ask you questions about your:  Alcohol use.  Tobacco use.  Drug use.  Emotional well-being.  Home and relationship well-being.  Sexual activity.  Eating habits.  History of falls.  Memory and ability to understand (cognition).  Work and work Statistician.  Reproductive health.  Screening You may have the following tests or measurements:  Height, weight, and BMI.  Blood pressure.  Lipid and cholesterol levels. These may be checked every 5 years, or more frequently if you are over 61 years old.  Skin check.  Lung cancer screening. You may have this screening every year starting at age 50 if you have a  30-pack-year history of smoking and currently smoke or have quit within the past 15 years.  Fecal occult blood test (FOBT) of the stool. You may have this test every year starting at age 34.  Flexible sigmoidoscopy or colonoscopy. You may have a sigmoidoscopy every 5 years or a colonoscopy every 10 years starting at age 86.  Hepatitis C blood test.  Hepatitis B blood test.  Sexually transmitted disease (STD) testing.  Diabetes screening. This is done by checking your blood sugar (glucose) after you have not eaten for a while (fasting). You may have this done every 1-3 years.  Bone density scan. This is done to screen for osteoporosis. You may have this done starting at age 58.  Mammogram. This may be done every 1-2 years. Talk to your health care provider about how often you should have regular mammograms.  Talk with your health care provider about your test results, treatment options, and if necessary, the need for more tests. Vaccines Your health care provider may recommend certain vaccines, such as:  Influenza vaccine. This is recommended every year.  Tetanus, diphtheria, and acellular pertussis (Tdap, Td) vaccine. You may need a Td booster every 10 years.  Varicella vaccine. You may need this if you have not been vaccinated.  Zoster vaccine. You may need this after age 44.  Measles, mumps, and rubella (MMR) vaccine. You may need at least one dose of MMR if you were born in 1957 or later. You may also need a second dose.  Pneumococcal 13-valent conjugate (PCV13) vaccine. One dose is recommended after age  65.  Pneumococcal polysaccharide (PPSV23) vaccine. One dose is recommended after age 73.  Meningococcal vaccine. You may need this if you have certain conditions.  Hepatitis A vaccine. You may need this if you have certain conditions or if you travel or work in places where you may be exposed to hepatitis A.  Hepatitis B vaccine. You may need this if you have certain  conditions or if you travel or work in places where you may be exposed to hepatitis B.  Haemophilus influenzae type b (Hib) vaccine. You may need this if you have certain conditions.  Talk to your health care provider about which screenings and vaccines you need and how often you need them. This information is not intended to replace advice given to you by your health care provider. Make sure you discuss any questions you have with your health care provider. Document Released: 03/31/2015 Document Revised: 11/22/2015 Document Reviewed: 01/03/2015 Elsevier Interactive Patient Education  [employer].

## 2017-07-22 NOTE — Assessment & Plan Note (Signed)
Continue gemfibrozil 600 mg daily.  Lipid panel 6 months ago with 10-year ASCVD risk of 5.4%.  We will recheck again in 6 months.

## 2017-07-22 NOTE — Assessment & Plan Note (Signed)
Stable.  Continue metformin 500 mg daily.  Follow-up in 6 months.  Repeat A1c at that time.

## 2017-07-22 NOTE — Assessment & Plan Note (Signed)
Stable.  Continue albuterol as needed.  Continue Singulair.  We will send in Advair and stop Dulera.

## 2017-07-22 NOTE — Progress Notes (Signed)
Subjective:  Tanya Newman is a 67 y.o. female who presents today for her annual comprehensive physical exam.    HPI:  She has no acute complaints today.   Her stable, chronic problems are outlined below: 1.  Mild intermittent asthma.  Currently on Singulair, albuterol, and Dulera.  Patient request to be switched back to Advair as she thinks that this worked better for her in the past. 2.  Hypothyroidism.  Stable on Synthroid 100 mcg daily. 3.  Hyperlipidemia.  Stable on gemfibrozil 600 mg daily. 4.  Prediabetes.  Stable on metformin 500 mg daily.  Lifestyle Diet: More mindful of what she eats.  Exercise: No specific exercises.   Depression screen PHQ 2/9 07/22/2017  Decreased Interest 0  Down, Depressed, Hopeless 0  PHQ - 2 Score 0    Health Maintenance Due  Topic Date Due  . HIV Screening  01/05/1966  . MAMMOGRAM  01/04/2002  . COLONOSCOPY  01/04/2002  . DEXA SCAN  01/04/2017  . PNA vac Low Risk Adult (1 of 2 - PCV13) 01/04/2017     ROS: Per HPI, otherwise a complete review of systems was negative.   PMH:  The following were reviewed and entered/updated in epic: Past Medical History:  Diagnosis Date  . Asthma   . Cataract   . Hyperlipidemia 01/22/2017  . Thyroid disease    Patient Active Problem List   Diagnosis Date Noted  . Hyperlipidemia 01/22/2017  . Knee pain 11/22/2016  . Personal history of squamous cell carcinoma of skin 07/22/2016  . Prediabetes 07/22/2016  . Mild intermittent asthma without complication 38/25/0539  . Hypothyroidism due to acquired atrophy of thyroid 04/27/2016   Past Surgical History:  Procedure Laterality Date  . ABDOMINAL HYSTERECTOMY    . BREAST SURGERY      Family History  Problem Relation Age of Onset  . Diabetes Mother   . Stroke Mother   . Hypertension Mother   . Early death Father   . Heart disease Father   . Heart disease Sister   . Diabetes Sister   . Hypertension Sister   . Stroke Brother   . Cancer  Brother     Medications- reviewed and updated Current Outpatient Medications  Medication Sig Dispense Refill  . albuterol (PROAIR HFA) 108 (90 Base) MCG/ACT inhaler Inhale 2 puffs every 6 (six) hours as needed into the lungs for wheezing or shortness of breath. 18 g 3  . cycloSPORINE (RESTASIS) 0.05 % ophthalmic emulsion 1 drop 2 (two) times daily.    Marland Kitchen gemfibrozil (LOPID) 600 MG tablet Take 1 tablet (600 mg total) daily by mouth. 90 tablet 3  . levothyroxine (SYNTHROID, LEVOTHROID) 100 MCG tablet Take 1 tablet (100 mcg total) by mouth daily before breakfast. 90 tablet 2  . metFORMIN (GLUCOPHAGE XR) 500 MG 24 hr tablet Take 1 tablet (500 mg total) daily with breakfast by mouth. 90 tablet 3  . montelukast (SINGULAIR) 10 MG tablet Take 1 tablet (10 mg total) at bedtime by mouth. 90 tablet 3  . Fluticasone-Salmeterol (ADVAIR DISKUS) 500-50 MCG/DOSE AEPB Inhale 1 puff into the lungs daily. 60 each 11   No current facility-administered medications for this visit.     Allergies-reviewed and updated Allergies  Allergen Reactions  . Amoxicillin Other (See Comments)  . Aspirin Other (See Comments)    Patient states it makes her bleed. Nose bleeds ect.     Social History   Socioeconomic History  . Marital status: Divorced    Spouse  name: Not on file  . Number of children: Not on file  . Years of education: Not on file  . Highest education level: Not on file  Occupational History  . Not on file  Social Needs  . Financial resource strain: Not on file  . Food insecurity:    Worry: Not on file    Inability: Not on file  . Transportation needs:    Medical: Not on file    Non-medical: Not on file  Tobacco Use  . Smoking status: Former Research scientist (life sciences)  . Smokeless tobacco: Never Used  Substance and Sexual Activity  . Alcohol use: No    Frequency: Never  . Drug use: No  . Sexual activity: Never  Lifestyle  . Physical activity:    Days per week: Not on file    Minutes per session: Not on  file  . Stress: Not on file  Relationships  . Social connections:    Talks on phone: Not on file    Gets together: Not on file    Attends religious service: Not on file    Active member of club or organization: Not on file    Attends meetings of clubs or organizations: Not on file    Relationship status: Not on file  Other Topics Concern  . Not on file  Social History Narrative  . Not on file    Objective:  Physical Exam: BP 130/80   Pulse 62   Temp 97.9 F (36.6 C) (Oral)   Resp 14   Ht 5\' 1"  (1.549 m)   Wt 163 lb (73.9 kg)   SpO2 98%   BMI 30.80 kg/m   Body mass index is 30.8 kg/m. Wt Readings from Last 3 Encounters:  07/22/17 163 lb (73.9 kg)  01/22/17 166 lb 3.2 oz (75.4 kg)  01/03/17 168 lb 3.2 oz (76.3 kg)   Gen: NAD, resting comfortably HEENT: TMs normal bilaterally. OP clear. No thyromegaly noted.  CV: RRR with no murmurs appreciated Pulm: NWOB, CTAB with no crackles, wheezes, or rhonchi GI: Normal bowel sounds present. Soft, Nontender, Nondistended. MSK: no edema, cyanosis, or clubbing noted Skin: warm, dry Neuro: CN2-12 grossly intact. Strength 5/5 in upper and lower extremities. Reflexes symmetric and intact bilaterally.  Psych: Normal affect and thought content  Assessment/Plan:  Mild intermittent asthma without complication Stable.  Continue albuterol as needed.  Continue Singulair.  We will send in Advair and stop Dulera.  Hypothyroidism due to acquired atrophy of thyroid Continue Synthroid.  Last TSH 6 months ago in range.  Will recheck in 6 months.  Hyperlipidemia Continue gemfibrozil 600 mg daily.  Lipid panel 6 months ago with 10-year ASCVD risk of 5.4%.  We will recheck again in 6 months.  Prediabetes Stable.  Continue metformin 500 mg daily.  Follow-up in 6 months.  Repeat A1c at that time.  Knee pain Recommended continued use of compression sleeve, continued home exercise program, and occasional use of NSAIDs as needed.  She will  follow-up with sports medicine as needed.   Preventative Healthcare: Patient up-to-date on screening blood work.  We will obtain hemogram and DEXA scan.  Reports having normal Cologuard 2 years ago.  Will obtain these records.  Discussed benefits of pneumonia vaccine today.  Patient deferred.  Will readdress at future office visit.  Patient Counseling(The following topics were reviewed and/or handout was given):  -Nutrition: Stressed importance of moderation in sodium/caffeine intake, saturated fat and cholesterol, caloric balance, sufficient intake of fresh fruits, vegetables, and  fiber.  -Stressed the importance of regular exercise.   -Substance Abuse: Discussed cessation/primary prevention of tobacco, alcohol, or other drug use; driving or other dangerous activities under the influence; availability of treatment for abuse.   -Injury prevention: Discussed safety belts, safety helmets, smoke detector, smoking near bedding or upholstery.   -Sexuality: Discussed sexually transmitted diseases, partner selection, use of condoms, avoidance of unintended pregnancy and contraceptive alternatives.   -Dental health: Discussed importance of regular tooth brushing, flossing, and dental visits.  -Health maintenance and immunizations reviewed. Please refer to Health maintenance section.  Return to care in 1 year for next preventative visit.   Algis Greenhouse. Jerline Pain, MD 07/22/2017 9:02 AM

## 2017-07-29 ENCOUNTER — Other Ambulatory Visit: Payer: Self-pay | Admitting: Family Medicine

## 2017-07-29 ENCOUNTER — Other Ambulatory Visit: Payer: Self-pay

## 2017-07-29 DIAGNOSIS — E2839 Other primary ovarian failure: Secondary | ICD-10-CM

## 2017-07-29 DIAGNOSIS — Z139 Encounter for screening, unspecified: Secondary | ICD-10-CM

## 2017-09-09 ENCOUNTER — Other Ambulatory Visit: Payer: Managed Care, Other (non HMO)

## 2017-09-09 ENCOUNTER — Ambulatory Visit: Payer: Managed Care, Other (non HMO)

## 2017-10-03 ENCOUNTER — Ambulatory Visit
Admission: RE | Admit: 2017-10-03 | Discharge: 2017-10-03 | Disposition: A | Discharge status: Home / Self Care | Payer: Managed Care, Other (non HMO) | Source: Ambulatory Visit | Attending: Family Medicine | Admitting: Family Medicine

## 2017-10-03 DIAGNOSIS — Z139 Encounter for screening, unspecified: Secondary | ICD-10-CM

## 2017-10-03 DIAGNOSIS — E2839 Other primary ovarian failure: Secondary | ICD-10-CM

## 2017-10-03 NOTE — Progress Notes (Signed)
Dr Marigene Ehlers interpretation of your lab work:  Your bone density scan is NORMAL. You do not have any signs of osteoporosis. We can recheck in 2 years.   If you have any additional questions, please give [place] a call or send [place] a message through [place].  Take care, Dr Jerline Pain

## 2017-10-07 ENCOUNTER — Other Ambulatory Visit: Payer: Self-pay | Admitting: Family Medicine

## 2017-10-07 DIAGNOSIS — R928 Other abnormal and inconclusive findings on diagnostic imaging of breast: Secondary | ICD-10-CM

## 2017-10-09 ENCOUNTER — Other Ambulatory Visit: Payer: Self-pay | Admitting: Family Medicine

## 2017-10-09 ENCOUNTER — Ambulatory Visit
Admission: RE | Admit: 2017-10-09 | Discharge: 2017-10-09 | Disposition: A | Discharge status: Home / Self Care | Payer: Managed Care, Other (non HMO) | Source: Ambulatory Visit | Attending: Family Medicine | Admitting: Family Medicine

## 2017-10-09 DIAGNOSIS — R928 Other abnormal and inconclusive findings on diagnostic imaging of breast: Secondary | ICD-10-CM

## 2017-10-09 DIAGNOSIS — R921 Mammographic calcification found on diagnostic imaging of breast: Secondary | ICD-10-CM

## 2017-10-28 ENCOUNTER — Encounter: Payer: Self-pay | Admitting: Family Medicine

## 2017-10-29 ENCOUNTER — Other Ambulatory Visit: Payer: Self-pay

## 2017-10-29 DIAGNOSIS — J4520 Mild intermittent asthma, uncomplicated: Secondary | ICD-10-CM

## 2017-10-29 DIAGNOSIS — E785 Hyperlipidemia, unspecified: Secondary | ICD-10-CM

## 2017-10-29 MED ORDER — METFORMIN HCL ER 500 MG PO TB24: 500.0000 mg | ORAL_TABLET | Freq: Every day | ORAL | 2 refills | Status: AC

## 2017-10-29 MED ORDER — LEVOTHYROXINE SODIUM 100 MCG PO TABS: 100.0000 ug | ORAL_TABLET | Freq: Every day | ORAL | 2 refills | Status: AC

## 2017-10-29 MED ORDER — FLUTICASONE-SALMETEROL 500-50 MCG/DOSE IN AEPB: 1.0000 | INHALATION_SPRAY | Freq: Every day | RESPIRATORY_TRACT | 7 refills | Status: AC

## 2017-10-29 MED ORDER — ALBUTEROL SULFATE HFA 108 (90 BASE) MCG/ACT IN AERS: 2.0000 | INHALATION_SPRAY | Freq: Four times a day (QID) | RESPIRATORY_TRACT | 2 refills | Status: AC | PRN

## 2017-10-29 MED ORDER — CYCLOSPORINE 0.05 % OP EMUL: 1.0000 [drp] | Freq: Two times a day (BID) | OPHTHALMIC | 7 refills | Status: DC

## 2017-10-29 MED ORDER — GEMFIBROZIL 600 MG PO TABS: 600.0000 mg | ORAL_TABLET | Freq: Every day | ORAL | 2 refills | Status: AC

## 2017-10-29 MED ORDER — MONTELUKAST SODIUM 10 MG PO TABS: 10.0000 mg | ORAL_TABLET | Freq: Every day | ORAL | 2 refills | Status: AC

## 2017-10-31 ENCOUNTER — Telehealth: Payer: Self-pay | Admitting: Family Medicine

## 2017-10-31 NOTE — Telephone Encounter (Signed)
Copied from [place]. Topic: Quick Communication - Rx Refill/Question >> Oct 31, 2017 11:20 AM Burchel, Abbi R wrote: Medication: cycloSPORINE (RESTASIS) 0.05 % ophthalmic emulsion   Leah ([facility]) calling to request v/o to change quantity to 60.  Please advise.  [phone]

## 2017-10-31 NOTE — Telephone Encounter (Signed)
See request °

## 2017-11-03 ENCOUNTER — Other Ambulatory Visit: Payer: Self-pay

## 2017-11-03 MED ORDER — CYCLOSPORINE 0.05 % OP EMUL: 1.0000 [drp] | Freq: Two times a day (BID) | OPHTHALMIC | 1 refills | Status: AC

## 2017-11-03 NOTE — Telephone Encounter (Signed)
I have sent a new Rx to the pharmacy with corrected quantity.

## 2018-06-30 ENCOUNTER — Telehealth: Payer: Self-pay | Admitting: Family Medicine

## 2018-06-30 NOTE — Telephone Encounter (Signed)
Called pt to let her know we are now offering virtual visits should she need anything. No answer, LVM.

## 2018-08-21 ENCOUNTER — Other Ambulatory Visit: Payer: Self-pay | Admitting: Family Medicine

## 2018-08-21 DIAGNOSIS — E785 Hyperlipidemia, unspecified: Secondary | ICD-10-CM

## 2018-08-21 NOTE — Telephone Encounter (Signed)
Left voice message for patience to call clinic.Appt needed.

## 2019-06-14 ENCOUNTER — Telehealth: Payer: Self-pay | Admitting: Family Medicine

## 2019-06-14 NOTE — Telephone Encounter (Signed)
Pt last seen in 07/2017 and needs OV with PCP  Left message for patient to call back and schedule Medicare Annual Wellness Visit (AWV) either virtually/audio only OR in office. Whatever the patients preference is.  No hx; please schedule at anytime with [facility] at [facility].

## 2019-08-28 IMAGING — DX DG KNEE AP/LAT W/ SUNRISE*R*
3 series · 3 of 3 positions shown · non-contrast
Comparison: None.

CLINICAL DATA: Right knee pain and swelling for 2-3 weeks. Pt says
pain is mainly around patella, but also notes some pain behind knee.
No known injury.

EXAM:
RIGHT KNEE 3 VIEWS

[knee standing ap]
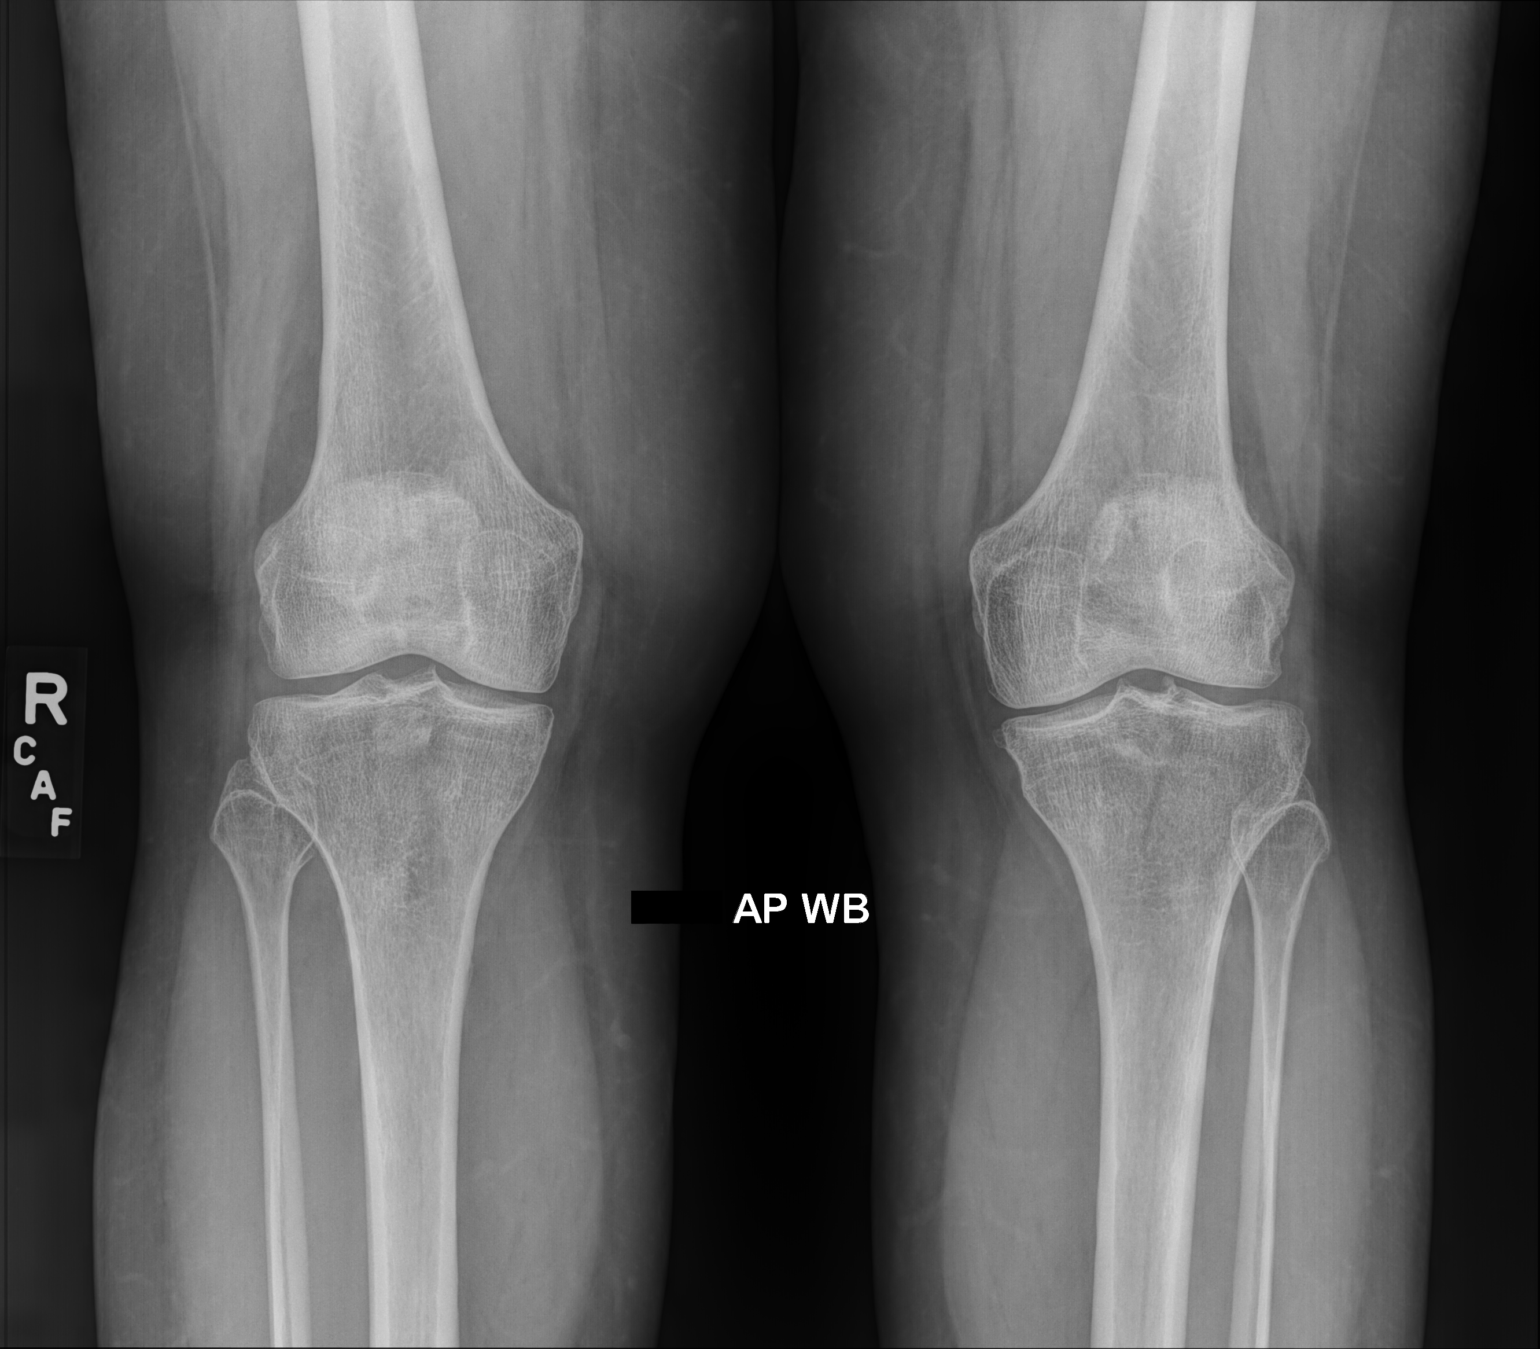

[knee standing lat]
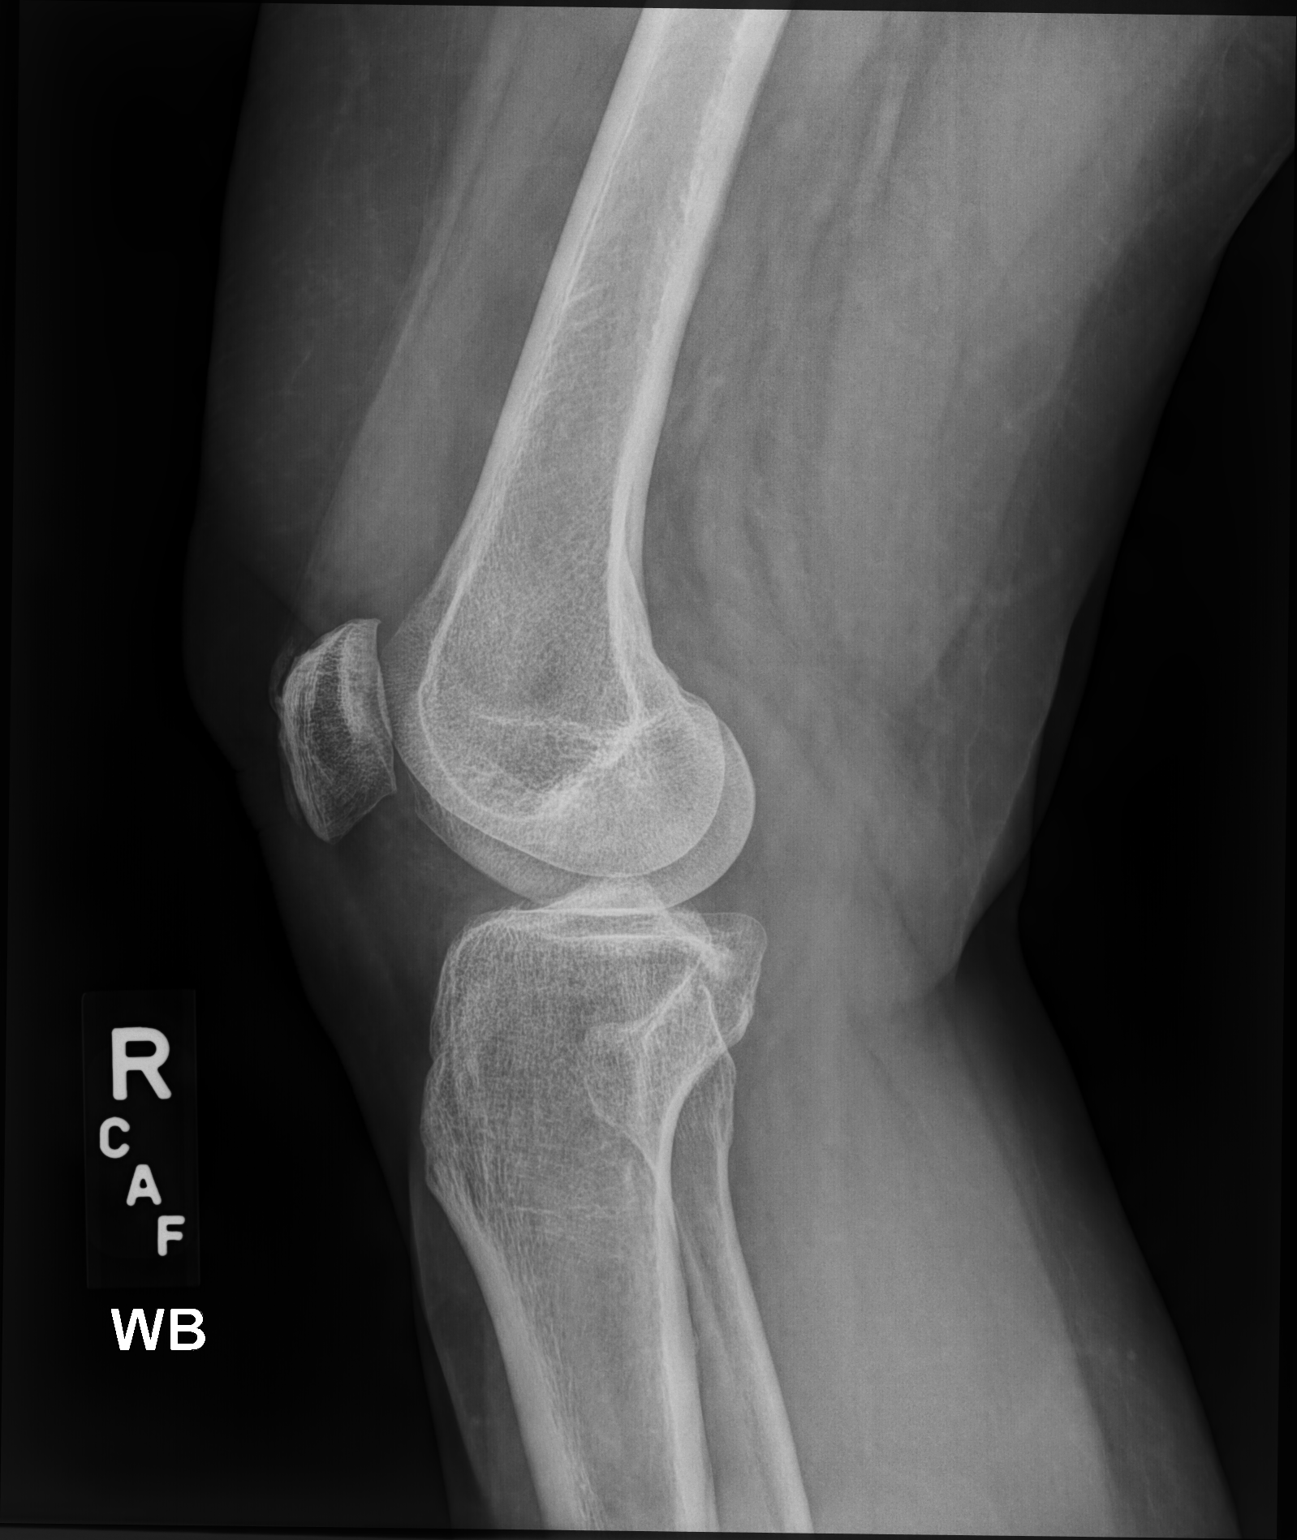

[sunrise]
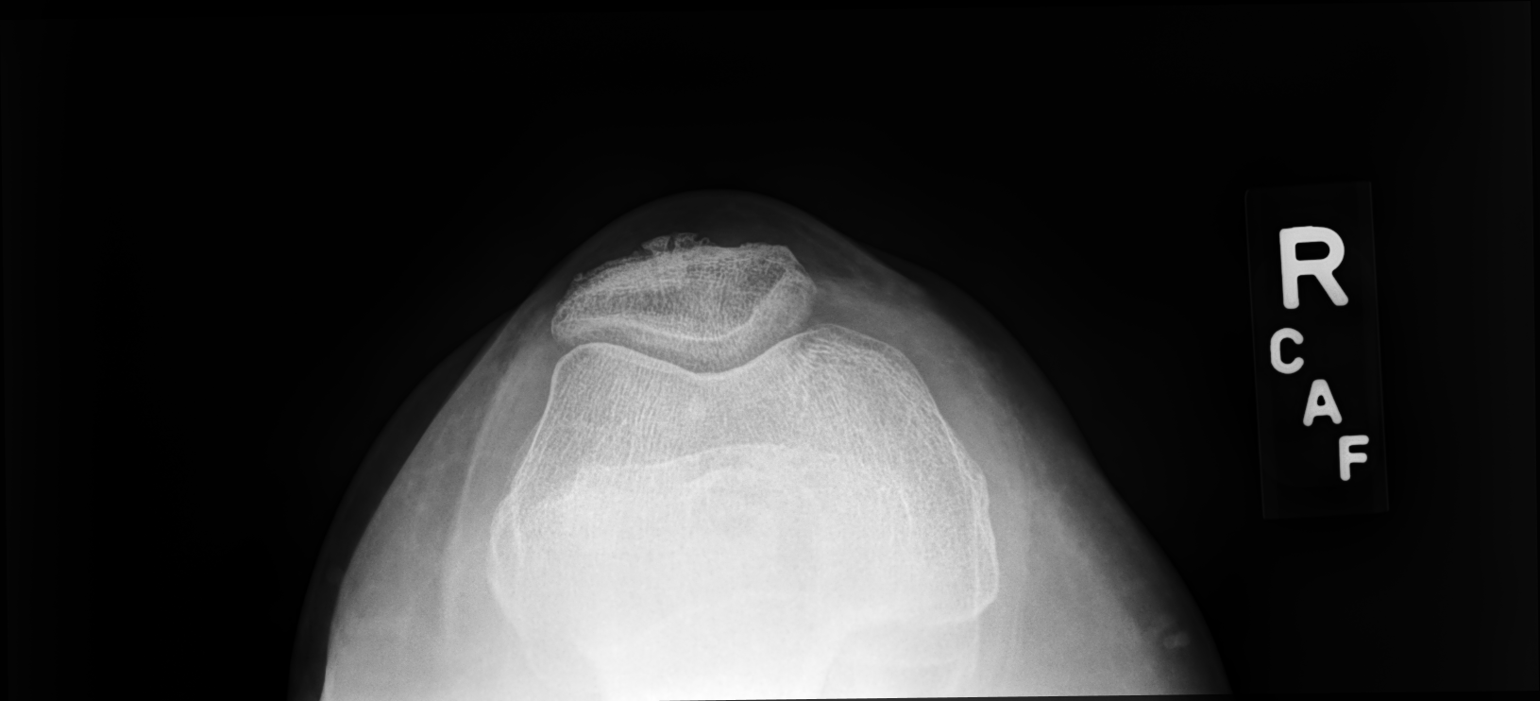

[3 of 3 positions shown; findings below may reference images not displayed]

FINDINGS: Three views of the right knee are provided. Comparison AP view of
the left knee provided.

Osseous alignment is normal, perhaps mild narrowing of the medial
compartment which is similar to the appearance of the left knee. No
degenerative spurring, osteophytes or other signs of advanced
degenerative osteoarthritis. No erosions or other signs of an
inflammatory arthritis.

No acute or suspicious osseous finding. Questionable small joint
effusion within the suprapatellar bursa.
IMPRESSION: 1. Perhaps mild degenerative narrowing of the medial compartment,
but no osteophytes or other signs of advanced osteoarthritis.
2. No acute appearing osseous abnormality.
3. Questionable small joint effusion.

## 2021-12-10 ENCOUNTER — Encounter: Payer: Self-pay | Admitting: *Deleted

## 2022-02-28 ENCOUNTER — Encounter: Payer: Self-pay | Admitting: *Deleted
